# Patient Record
Sex: Male | Born: 1965 | Hispanic: Yes | Marital: Single | Smoking: Never smoker
Health system: Southern US, Community
[De-identification: ages and names within clinical notes are randomized; demographics above are authoritative.]

---

## 2017-07-25 ENCOUNTER — Other Ambulatory Visit: Payer: Self-pay

## 2017-07-25 ENCOUNTER — Emergency Department
Admission: EM | Admit: 2017-07-25 | Discharge: 2017-07-26 | Disposition: A | Discharge status: Home / Self Care | Payer: No Typology Code available for payment source | Attending: Emergency Medicine | Admitting: Emergency Medicine

## 2017-07-25 ENCOUNTER — Emergency Department: Payer: No Typology Code available for payment source

## 2017-07-25 ENCOUNTER — Encounter: Payer: Self-pay | Admitting: Emergency Medicine

## 2017-07-25 DIAGNOSIS — T07XXXA Unspecified multiple injuries, initial encounter: Secondary | ICD-10-CM

## 2017-07-25 DIAGNOSIS — S0003XA Contusion of scalp, initial encounter: Secondary | ICD-10-CM | POA: Insufficient documentation

## 2017-07-25 DIAGNOSIS — M791 Myalgia, unspecified site: Secondary | ICD-10-CM | POA: Insufficient documentation

## 2017-07-25 DIAGNOSIS — S0990XA Unspecified injury of head, initial encounter: Secondary | ICD-10-CM

## 2017-07-25 DIAGNOSIS — Y929 Unspecified place or not applicable: Secondary | ICD-10-CM | POA: Diagnosis not present

## 2017-07-25 DIAGNOSIS — Y9389 Activity, other specified: Secondary | ICD-10-CM | POA: Insufficient documentation

## 2017-07-25 DIAGNOSIS — Y999 Unspecified external cause status: Secondary | ICD-10-CM | POA: Diagnosis not present

## 2017-07-25 DIAGNOSIS — M7918 Myalgia, other site: Secondary | ICD-10-CM

## 2017-07-25 MED ORDER — OXYCODONE-ACETAMINOPHEN 5-325 MG PO TABS
2.0000 | ORAL_TABLET | Freq: Once | ORAL | Status: AC
  Administered 2017-07-25: 2 via ORAL
  Filled 2017-07-25: qty 2

## 2017-07-25 NOTE — ED Notes (Signed)
Dr. Webster at bedside.  
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
## 2017-07-25 NOTE — ED Notes (Signed)
Pt to CT and xray via stretcher.

## 2017-07-26 MED ORDER — TRAMADOL HCL 50 MG PO TABS: 50.0000 mg | ORAL_TABLET | Freq: Four times a day (QID) | ORAL | 0 refills | Status: DC | PRN

## 2017-07-26 MED ORDER — BACITRACIN ZINC 500 UNIT/GM EX OINT
TOPICAL_OINTMENT | Freq: Once | CUTANEOUS | Status: AC
  Administered 2017-07-26: 5 via TOPICAL
  Filled 2017-07-26: qty 4.5

## 2017-07-26 NOTE — Discharge Instructions (Addendum)
Follow up with your primary care physician
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
## 2017-07-26 NOTE — ED Notes (Signed)
Dr webster in to followup 
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
## 2020-11-18 ENCOUNTER — Other Ambulatory Visit: Payer: Self-pay | Admitting: Physician Assistant

## 2020-11-18 ENCOUNTER — Ambulatory Visit
Admission: RE | Admit: 2020-11-18 | Discharge: 2020-11-18 | Disposition: A | Discharge status: Home / Self Care | Payer: Worker's Compensation | Source: Ambulatory Visit | Attending: Physician Assistant | Admitting: Physician Assistant

## 2020-11-18 DIAGNOSIS — M25511 Pain in right shoulder: Secondary | ICD-10-CM | POA: Diagnosis present

## 2020-11-21 ENCOUNTER — Other Ambulatory Visit: Payer: Self-pay

## 2020-11-21 ENCOUNTER — Emergency Department
Admission: EM | Admit: 2020-11-21 | Discharge: 2020-11-21 | Disposition: A | Discharge status: Home / Self Care | Payer: BC Managed Care – PPO | Attending: Student in an Organized Health Care Education/Training Program | Admitting: Student in an Organized Health Care Education/Training Program

## 2020-11-21 DIAGNOSIS — R350 Frequency of micturition: Secondary | ICD-10-CM | POA: Diagnosis present

## 2020-11-21 DIAGNOSIS — R300 Dysuria: Secondary | ICD-10-CM | POA: Insufficient documentation

## 2020-11-21 LAB — URINALYSIS, COMPLETE (UACMP) WITH MICROSCOPIC
Bacteria, UA: NONE SEEN
Bilirubin Urine: NEGATIVE
Glucose, UA: NEGATIVE mg/dL
Hgb urine dipstick: NEGATIVE
Ketones, ur: NEGATIVE mg/dL
Leukocytes,Ua: NEGATIVE
Nitrite: NEGATIVE
Protein, ur: NEGATIVE mg/dL
Specific Gravity, Urine: 1.019 (ref 1.005–1.030)
Squamous Epithelial / LPF: NONE SEEN (ref 0–5)
WBC, UA: NONE SEEN WBC/hpf (ref 0–5)
pH: 6 (ref 5.0–8.0)

## 2020-11-21 MED ORDER — TAMSULOSIN HCL 0.4 MG PO CAPS: 0.4000 mg | ORAL_CAPSULE | Freq: Every day | ORAL | 0 refills | Status: DC

## 2020-11-21 NOTE — ED Provider Notes (Signed)
[facility] Emergency Department Provider Note    Event Date/Time   First MD Initiated Contact with Patient 11/21/20 1829     (approximate)  I have reviewed the triage vital signs and the nursing notes.   HISTORY  Chief Complaint Dysuria    HPI Miguel Caldwell is a 55 y.o. male presents to the ER for concern for increased urinary frequency as well as hesitancy and feeling of incomplete voiding.  Denies any hematuria no fevers.  The symptoms of been ongoing for more than a year.  Denies any other concerns.  States he is primarily hoping to have his prostate evaluated.  Does not have a PCP.  History reviewed. No pertinent past medical history. No family history on file. History reviewed. No pertinent surgical history. There are no problems to display for this patient.     Prior to Admission medications   Medication Sig Start Date End Date Taking? Authorizing Provider  tamsulosin (FLOMAX) 0.4 MG CAPS capsule Take 1 capsule (0.4 mg total) by mouth daily after supper. 11/21/20  Yes Willy Eddy, MD  traMADol (ULTRAM) 50 MG tablet Take 1 tablet (50 mg total) by mouth every 6 (six) hours as needed. 07/26/17   Rebecka Apley, MD    Allergies Patient has no known allergies.    Social History Social History   Tobacco Use   Smoking status: Never   Smokeless tobacco: Never  Substance Use Topics   Alcohol use: Yes   Drug use: Never    Review of Systems Patient denies headaches, rhinorrhea, blurry vision, numbness, shortness of breath, chest pain, edema, cough, abdominal pain, nausea, vomiting, diarrhea, dysuria, fevers, rashes or hallucinations unless otherwise stated above in HPI. ____________________________________________   PHYSICAL EXAM:  VITAL SIGNS: Vitals:   11/21/20 1656  BP: 129/82  Pulse: 66  Resp: 16  Temp: 98.5 F (36.9 C)  SpO2: 96%    Constitutional: Alert and oriented. Well appearing and in no acute  distress. Eyes: Conjunctivae are normal.  Head: Atraumatic. Nose: No congestion/rhinnorhea. Mouth/Throat: Mucous membranes are moist.   Neck: Painless ROM.  Cardiovascular:   Good peripheral circulation. Respiratory: Normal respiratory effort.  No retractions.  Gastrointestinal: Soft and nontender.  Musculoskeletal: No lower extremity tenderness .  No joint effusions. Neurologic:  Normal speech and language. No gross focal neurologic deficits are appreciated.  Skin:  Skin is warm, dry and intact. No rash noted. Psychiatric: Mood and affect are normal. Speech and behavior are normal.  ____________________________________________   LABS (all labs ordered are listed, but only abnormal results are displayed)  Results for orders placed or performed during the hospital encounter of 11/21/20 (from the past 24 hour(s))  Urinalysis, Complete w Microscopic     Status: Abnormal   Collection Time: 11/21/20  5:03 PM  Result Value Ref Range   Color, Urine YELLOW (A) YELLOW   APPearance CLEAR (A) CLEAR   Specific Gravity, Urine 1.019 1.005 - 1.030   pH 6.0 5.0 - 8.0   Glucose, UA NEGATIVE NEGATIVE mg/dL   Hgb urine dipstick NEGATIVE NEGATIVE   Bilirubin Urine NEGATIVE NEGATIVE   Ketones, ur NEGATIVE NEGATIVE mg/dL   Protein, ur NEGATIVE NEGATIVE mg/dL   Nitrite NEGATIVE NEGATIVE   Leukocytes,Ua NEGATIVE NEGATIVE   RBC / HPF 0-5 0 - 5 RBC/hpf   WBC, UA NONE SEEN 0 - 5 WBC/hpf   Bacteria, UA NONE SEEN NONE SEEN   Squamous Epithelial / LPF NONE SEEN 0 - 5  ____________________________________________ ____________________________________________   PROCEDURES  Procedure(s) performed:  Procedures    Critical Care performed: no ____________________________________________   INITIAL IMPRESSION / ASSESSMENT AND PLAN / ED COURSE  Pertinent labs & imaging results that were available during my care of the patient were reviewed by me and considered in my medical decision making (see  chart for details).   DDX: Prostatitis, BPH, cystitis, urethritis, urinary retention  [name] is a 55 y.o. who presents to the ED with presentation as described above most consistent with BPH.  I recommended digital prostate exam which patient declines.  Would like to trial Flomax.  No sign of UTI.  Exam otherwise reassuring.  Will give referral for outpatient follow-up discussed signs and symptoms for which he should return to the ER.    The patient was evaluated in Emergency Department today for the symptoms described in the history of present illness. He/she was evaluated in the context of the global COVID-19 pandemic, which necessitated consideration that the patient might be at risk for infection with the SARS-CoV-2 virus that causes COVID-19. Institutional protocols and algorithms that pertain to the evaluation of patients at risk for COVID-19 are in a state of rapid change based on information released by regulatory bodies including the CDC and federal and state organizations. These policies and algorithms were followed during the patient's care in the ED.   ____________________________________________   FINAL CLINICAL IMPRESSION(S) / ED DIAGNOSES  Final diagnoses:  Urinary frequency      NEW MEDICATIONS STARTED DURING THIS VISIT:  New Prescriptions   TAMSULOSIN (FLOMAX) 0.4 MG CAPS CAPSULE    Take 1 capsule (0.4 mg total) by mouth daily after supper.     Note:  This document was prepared using Dragon voice recognition software and may include unintentional dictation errors.     Willy Eddy, MD 11/21/20 [phone]

## 2020-11-21 NOTE — ED Triage Notes (Signed)
Pt to ER via POV with complaints of urinary symptoms x2 years.   Pt reports issue with starting a stream and difficulty maintaining stream. Pt also reports for one year he has been having intermittent dyuria and pain to the tip of his penis when he urinates. Reports some days he feels fine and is able to urinate as normal, other times it is painful and difficult.   Pt reports today it is very painful when he tries to urinate.

## 2022-09-11 ENCOUNTER — Ambulatory Visit: Payer: BC Managed Care – PPO | Admitting: Internal Medicine

## 2022-09-11 ENCOUNTER — Encounter: Payer: Self-pay | Admitting: Internal Medicine

## 2022-09-11 VITALS — BP 124/64 | HR 84 | Ht 68.0 in | Wt 173.0 lb

## 2022-09-11 DIAGNOSIS — Z1331 Encounter for screening for depression: Secondary | ICD-10-CM | POA: Diagnosis not present

## 2022-09-11 DIAGNOSIS — N401 Enlarged prostate with lower urinary tract symptoms: Secondary | ICD-10-CM | POA: Diagnosis not present

## 2022-09-11 DIAGNOSIS — R310 Gross hematuria: Secondary | ICD-10-CM | POA: Diagnosis not present

## 2022-09-11 DIAGNOSIS — N410 Acute prostatitis: Secondary | ICD-10-CM

## 2022-09-11 DIAGNOSIS — R3914 Feeling of incomplete bladder emptying: Secondary | ICD-10-CM

## 2022-09-11 LAB — POCT URINALYSIS DIPSTICK
Bilirubin, UA: NEGATIVE
Blood, UA: NEGATIVE
Glucose, UA: NEGATIVE
Ketones, UA: NEGATIVE
Leukocytes, UA: NEGATIVE
Nitrite, UA: NEGATIVE
Protein, UA: NEGATIVE
Spec Grav, UA: 1.02 (ref 1.010–1.025)
Urobilinogen, UA: 0.2 E.U./dL
pH, UA: 6 (ref 5.0–8.0)

## 2022-09-11 MED ORDER — TAMSULOSIN HCL 0.4 MG PO CAPS: 0.4000 mg | ORAL_CAPSULE | Freq: Every day | ORAL | 0 refills | Status: DC

## 2022-09-11 MED ORDER — SULFAMETHOXAZOLE-TRIMETHOPRIM 800-160 MG PO TABS: 1.0000 | ORAL_TABLET | Freq: Two times a day (BID) | ORAL | 0 refills | Status: AC

## 2022-09-11 NOTE — Progress Notes (Signed)
Established Patient Office Visit  Subjective:  Patient ID: Miguel Caldwell, male    DOB: Jan 16, 1966  Age: 57 y.o. MRN: [id number]  Chief Complaint  Patient presents with   Follow-up   New Patient (Initial Visit)    New patient visit    Here to establish IM care. C/o several months history of intermittent hematuria and recent pain in his perineal region. C/o slower urinary stream, incomplete bladder emptying and nocturia.    No other concerns at this time.   History reviewed. No pertinent past medical history.  History reviewed. No pertinent surgical history.  Social History   Socioeconomic History   Marital status: Single    Spouse name: Not on file   Number of children: 1   Years of education: Not on file   Highest education level: 4th grade  Occupational History   Occupation: Producer, television/film/video  Tobacco Use   Smoking status: Never   Smokeless tobacco: Never  Vaping Use   Vaping Use: Never used  Substance and Sexual Activity   Alcohol use: Yes    Alcohol/week: 3.0 standard drinks of alcohol    Types: 3 Cans of beer per week   Drug use: Never   Sexual activity: Yes  Other Topics Concern   Not on file  Social History Narrative   Not on file   Social Determinants of Health   Financial Resource Strain: Not on file  Food Insecurity: Not on file  Transportation Needs: Not on file  Physical Activity: Not on file  Stress: Not on file  Social Connections: Not on file  Intimate Partner Violence: Not on file    History reviewed. No pertinent family history.  No Known Allergies  Review of Systems  Constitutional: Negative.   HENT: Negative.    Eyes: Negative.   Respiratory: Negative.    Cardiovascular: Negative.   Gastrointestinal: Negative.   Genitourinary: Negative.   Musculoskeletal:  Positive for joint pain and myalgias.       Pain in elbows bilaterally  Skin: Negative.   Neurological: Negative.   Endo/Heme/Allergies: Negative.         Objective:   BP 124/64   Pulse 84   Ht 5\' 8"  (1.727 m)   Wt 173 lb (78.5 kg)   SpO2 95%   BMI 26.30 kg/m   Vitals:   09/11/22 1428  BP: 124/64  Pulse: 84  Height: 5\' 8"  (1.727 m)  Weight: 173 lb (78.5 kg)  SpO2: 95%  BMI (Calculated): 26.31    Physical Exam Vitals reviewed.  Constitutional:      Appearance: Normal appearance.  HENT:     Head: Normocephalic.     Left Ear: There is no impacted cerumen.     Nose: Nose normal.     Mouth/Throat:     Mouth: Mucous membranes are moist.     Pharynx: No posterior oropharyngeal erythema.  Eyes:     Extraocular Movements: Extraocular movements intact.     Pupils: Pupils are equal, round, and reactive to light.  Cardiovascular:     Rate and Rhythm: Regular rhythm.     Chest Wall: PMI is not displaced.     Pulses: Normal pulses.     Heart sounds: Normal heart sounds. No murmur heard. Pulmonary:     Effort: Pulmonary effort is normal.     Breath sounds: Normal air entry. No rhonchi or rales.  Abdominal:     General: Abdomen is flat. Bowel sounds are normal. There is  no distension.     Palpations: Abdomen is soft. There is no hepatomegaly, splenomegaly or mass.     Tenderness: There is no abdominal tenderness.  Genitourinary:    Comments: Rectal exam: normal sphincteric tone negative without mass, lesions or tenderness, no tenderness noted, no external hemorrhoids noted.  Prostate mildly enlarged but non tender. Musculoskeletal:        General: Normal range of motion.     Cervical back: Normal range of motion and neck supple.     Right lower leg: No edema.     Left lower leg: No edema.  Skin:    General: Skin is warm and dry.  Neurological:     General: No focal deficit present.     Mental Status: He is alert and oriented to person, place, and time.     Cranial Nerves: No cranial nerve deficit.     Motor: No weakness.  Psychiatric:        Mood and Affect: Mood normal.        Behavior: Behavior normal.      Results for orders placed or performed in visit on 09/11/22  POCT Urinalysis Dipstick (81002)  Result Value Ref Range   Color, UA Yellow    Clarity, UA Cloudy    Glucose, UA Negative Negative   Bilirubin, UA Negative    Ketones, UA Negative    Spec Grav, UA 1.020 1.010 - 1.025   Blood, UA Negative    pH, UA 6.0 5.0 - 8.0   Protein, UA Negative Negative   Urobilinogen, UA 0.2 0.2 or 1.0 E.U./dL   Nitrite, UA Negative    Leukocytes, UA Negative Negative   Appearance     Odor      Recent Results (from the past 2160 hour(Miguel Caldwell))  POCT Urinalysis Dipstick ([id number])     Status: Normal   Collection Time: 09/11/22  3:10 PM  Result Value Ref Range   Color, UA Yellow    Clarity, UA Cloudy    Glucose, UA Negative Negative   Bilirubin, UA Negative    Ketones, UA Negative    Spec Grav, UA 1.020 1.010 - 1.025   Blood, UA Negative    pH, UA 6.0 5.0 - 8.0   Protein, UA Negative Negative   Urobilinogen, UA 0.2 0.2 or 1.0 E.U./dL   Nitrite, UA Negative    Leukocytes, UA Negative Negative   Appearance     Odor        Assessment & Plan:  As per problem list. Repeat PSA after completion of atbs if elevated.  Problem List Items Addressed This Visit       Genitourinary   Acute prostatitis - Primary   Relevant Medications   sulfamethoxazole-trimethoprim (BACTRIM DS) 800-160 MG tablet   tamsulosin (FLOMAX) 0.4 MG CAPS capsule   Other Relevant Orders   POCT Urinalysis Dipstick ([id number]) (Completed)   Benign prostatic hyperplasia with incomplete bladder emptying   Relevant Medications   tamsulosin (FLOMAX) 0.4 MG CAPS capsule   Other Relevant Orders   PSA   CBC With Diff/Platelet   Other Visit Diagnoses     Gross hematuria       Relevant Orders   Protime-INR   APTT   POCT Urinalysis Dipstick ([id number]) (Completed)       Return in about 2 weeks (around 09/25/2022) for lab results.   Total time spent: 35 minutes  Luna Fuse, MD  09/11/2022   This document  may have been prepared by Miguel Caldwell  Voice Recognition software and as such may include unintentional dictation errors.

## 2022-09-12 LAB — CBC WITH DIFF/PLATELET
Basophils Absolute: 0 10*3/uL (ref 0.0–0.2)
Basos: 1 %
EOS (ABSOLUTE): 0.1 10*3/uL (ref 0.0–0.4)
Eos: 2 %
Hematocrit: 48.6 % (ref 37.5–51.0)
Hemoglobin: 16.6 g/dL (ref 13.0–17.7)
Immature Grans (Abs): 0 10*3/uL (ref 0.0–0.1)
Immature Granulocytes: 0 %
Lymphocytes Absolute: 2 10*3/uL (ref 0.7–3.1)
Lymphs: 38 %
MCH: 30.6 pg (ref 26.6–33.0)
MCHC: 34.2 g/dL (ref 31.5–35.7)
MCV: 90 fL (ref 79–97)
Monocytes Absolute: 0.4 10*3/uL (ref 0.1–0.9)
Monocytes: 8 %
Neutrophils Absolute: 2.7 10*3/uL (ref 1.4–7.0)
Neutrophils: 51 %
Platelets: 231 10*3/uL (ref 150–450)
RBC: 5.42 x10E6/uL (ref 4.14–5.80)
RDW: 13.3 % (ref 11.6–15.4)
WBC: 5.2 10*3/uL (ref 3.4–10.8)

## 2022-09-12 LAB — PSA: Prostate Specific Ag, Serum: 0.6 ng/mL (ref 0.0–4.0)

## 2022-09-15 NOTE — Progress Notes (Signed)
Patient notified

## 2022-09-29 ENCOUNTER — Ambulatory Visit: Payer: BC Managed Care – PPO | Admitting: Internal Medicine

## 2022-10-14 ENCOUNTER — Ambulatory Visit (INDEPENDENT_AMBULATORY_CARE_PROVIDER_SITE_OTHER): Payer: BC Managed Care – PPO | Admitting: Internal Medicine

## 2022-10-14 VITALS — BP 125/79 | HR 75 | Ht 68.0 in | Wt 183.4 lb

## 2022-10-14 DIAGNOSIS — N401 Enlarged prostate with lower urinary tract symptoms: Secondary | ICD-10-CM | POA: Diagnosis not present

## 2022-10-14 DIAGNOSIS — R3914 Feeling of incomplete bladder emptying: Secondary | ICD-10-CM

## 2022-10-14 DIAGNOSIS — Z0001 Encounter for general adult medical examination with abnormal findings: Secondary | ICD-10-CM

## 2022-10-14 DIAGNOSIS — N410 Acute prostatitis: Secondary | ICD-10-CM

## 2022-10-14 MED ORDER — TAMSULOSIN HCL 0.4 MG PO CAPS: 0.4000 mg | ORAL_CAPSULE | Freq: Every day | ORAL | 0 refills | Status: AC

## 2022-10-14 NOTE — Progress Notes (Signed)
Established Patient Office Visit  Subjective:  Patient ID: Miguel Caldwell, male    DOB: Feb 22, 1966  Age: 57 y.o. MRN: [id number]  Chief Complaint  Patient presents with   Follow-up    2 week F/U    Urinary symptoms have improved, no hematuria or perineal discomfort but only started taking antibiotics a week ago.    No other concerns at this time.   No past medical history on file.  No past surgical history on file.  Social History   Socioeconomic History   Marital status: Single    Spouse name: Not on file   Number of children: 1   Years of education: Not on file   Highest education level: 4th grade  Occupational History   Occupation: Producer, television/film/video  Tobacco Use   Smoking status: Never   Smokeless tobacco: Never  Vaping Use   Vaping status: Never Used  Substance and Sexual Activity   Alcohol use: Yes    Alcohol/week: 3.0 standard drinks of alcohol    Types: 3 Cans of beer per week   Drug use: Never   Sexual activity: Yes  Other Topics Concern   Not on file  Social History Narrative   Not on file   Social Determinants of Health   Financial Resource Strain: Not on file  Food Insecurity: Not on file  Transportation Needs: Not on file  Physical Activity: Not on file  Stress: Not on file  Social Connections: Not on file  Intimate Partner Violence: Not on file    No family history on file.  No Known Allergies  Review of Systems  Constitutional: Negative.   HENT: Negative.    Eyes: Negative.   Respiratory: Negative.    Cardiovascular: Negative.   Gastrointestinal: Negative.   Genitourinary: Negative.   Musculoskeletal:  Positive for joint pain and myalgias.       Pain in elbows bilaterally  Skin: Negative.   Neurological: Negative.   Endo/Heme/Allergies: Negative.        Objective:   BP 125/79   Pulse 75   Ht 5\' 8"  (1.727 m)   Wt 183 lb 6.4 oz (83.2 kg)   SpO2 94%   BMI 27.89 kg/m   Vitals:   10/14/22 0910  BP: 125/79   Pulse: 75  Height: 5\' 8"  (1.727 m)  Weight: 183 lb 6.4 oz (83.2 kg)  SpO2: 94%  BMI (Calculated): 27.89    Physical Exam Vitals reviewed.  Constitutional:      Appearance: Normal appearance.  HENT:     Head: Normocephalic.     Left Ear: There is no impacted cerumen.     Nose: Nose normal.     Mouth/Throat:     Mouth: Mucous membranes are moist.     Pharynx: No posterior oropharyngeal erythema.  Eyes:     Extraocular Movements: Extraocular movements intact.     Pupils: Pupils are equal, round, and reactive to light.  Cardiovascular:     Rate and Rhythm: Regular rhythm.     Chest Wall: PMI is not displaced.     Pulses: Normal pulses.     Heart sounds: Normal heart sounds. No murmur heard. Pulmonary:     Effort: Pulmonary effort is normal.     Breath sounds: Normal air entry. No rhonchi or rales.  Abdominal:     General: Abdomen is flat. Bowel sounds are normal. There is no distension.     Palpations: Abdomen is soft. There is no hepatomegaly, splenomegaly  or mass.     Tenderness: There is no abdominal tenderness.  Genitourinary:    Comments: Rectal exam: normal sphincteric tone negative without mass, lesions or tenderness, no tenderness noted, no external hemorrhoids noted.  Prostate mildly enlarged but non tender. Musculoskeletal:        General: Normal range of motion.     Cervical back: Normal range of motion and neck supple.     Right lower leg: No edema.     Left lower leg: No edema.  Skin:    General: Skin is warm and dry.  Neurological:     General: No focal deficit present.     Mental Status: He is alert and oriented to person, place, and time.     Cranial Nerves: No cranial nerve deficit.     Motor: No weakness.  Psychiatric:        Mood and Affect: Mood normal.        Behavior: Behavior normal.      No results found for any visits on 10/14/22.  Recent Results (from the past 2160 hour(Durant Scibilia))  POCT Urinalysis Dipstick ([id number])     Status: Normal    Collection Time: 09/11/22  3:10 PM  Result Value Ref Range   Color, UA Yellow    Clarity, UA Cloudy    Glucose, UA Negative Negative   Bilirubin, UA Negative    Ketones, UA Negative    Spec Grav, UA 1.020 1.010 - 1.025   Blood, UA Negative    pH, UA 6.0 5.0 - 8.0   Protein, UA Negative Negative   Urobilinogen, UA 0.2 0.2 or 1.0 E.U./dL   Nitrite, UA Negative    Leukocytes, UA Negative Negative   Appearance     Odor    PSA     Status: None   Collection Time: 09/11/22  3:11 PM  Result Value Ref Range   Prostate Specific Ag, Serum 0.6 0.0 - 4.0 ng/mL    Comment: Roche ECLIA methodology. According to the American Urological Association, Serum PSA should decrease and remain at undetectable levels after radical prostatectomy. The AUA defines biochemical recurrence as an initial PSA value 0.2 ng/mL or greater followed by a subsequent confirmatory PSA value 0.2 ng/mL or greater. Values obtained with different assay methods or kits cannot be used interchangeably. Results cannot be interpreted as absolute evidence of the presence or absence of malignant disease.   CBC With Diff/Platelet     Status: None   Collection Time: 09/11/22  3:11 PM  Result Value Ref Range   WBC 5.2 3.4 - 10.8 x10E3/uL   RBC 5.42 4.14 - 5.80 x10E6/uL   Hemoglobin 16.6 13.0 - 17.7 g/dL   Hematocrit 56.2 13.0 - 51.0 %   MCV 90 79 - 97 fL   MCH 30.6 26.6 - 33.0 pg   MCHC 34.2 31.5 - 35.7 g/dL   RDW 86.5 78.4 - 69.6 %   Platelets 231 150 - 450 x10E3/uL   Neutrophils 51 Not Estab. %   Lymphs 38 Not Estab. %   Monocytes 8 Not Estab. %   Eos 2 Not Estab. %   Basos 1 Not Estab. %   Neutrophils Absolute 2.7 1.4 - 7.0 x10E3/uL   Lymphocytes Absolute 2.0 0.7 - 3.1 x10E3/uL   Monocytes Absolute 0.4 0.1 - 0.9 x10E3/uL   EOS (ABSOLUTE) 0.1 0.0 - 0.4 x10E3/uL   Basophils Absolute 0.0 0.0 - 0.2 x10E3/uL   Immature Granulocytes 0 Not Estab. %   Immature Grans (Abs)  0.0 0.0 - 0.1 x10E3/uL      Assessment & Plan:   Complete atbs and check PSA next week Problem List Items Addressed This Visit       Genitourinary   Acute prostatitis - Primary   Relevant Orders   PSA   Benign prostatic hyperplasia with incomplete bladder emptying   Other Visit Diagnoses     Encounter for general adult medical examination with abnormal findings       Relevant Orders   Comprehensive metabolic panel   Lipid panel   CBC With Diff/Platelet       Return in about 2 weeks (around 10/28/2022) for cpe with labs prior.   Total time spent: 20 minutes  Luna Fuse, MD  10/14/2022   This document may have been prepared by [facility] Voice Recognition software and as such may include unintentional dictation errors.

## 2022-10-28 ENCOUNTER — Ambulatory Visit (INDEPENDENT_AMBULATORY_CARE_PROVIDER_SITE_OTHER): Payer: BC Managed Care – PPO | Admitting: Internal Medicine

## 2022-10-28 VITALS — BP 121/75 | HR 63 | Ht 68.0 in | Wt 187.6 lb

## 2022-10-28 DIAGNOSIS — N410 Acute prostatitis: Secondary | ICD-10-CM | POA: Diagnosis not present

## 2022-10-28 DIAGNOSIS — N401 Enlarged prostate with lower urinary tract symptoms: Secondary | ICD-10-CM | POA: Diagnosis not present

## 2022-10-28 DIAGNOSIS — R3914 Feeling of incomplete bladder emptying: Secondary | ICD-10-CM | POA: Diagnosis not present

## 2022-10-28 DIAGNOSIS — Z0001 Encounter for general adult medical examination with abnormal findings: Secondary | ICD-10-CM

## 2022-10-28 NOTE — Progress Notes (Signed)
Established Patient Office Visit  Subjective:  Patient ID: Miguel Caldwell, male    DOB: [birth year]  Age: 57 y.o. MRN: [id number]  No chief complaint on file.   Still hasn't finished his antibiotics as he missed 3 days of Rx. Also failed to have his previsit labs done.    No other concerns at this time.   No past medical history on file.  No past surgical history on file.  Social History   Socioeconomic History   Marital status: Single    Spouse name: Not on file   Number of children: 1   Years of education: Not on file   Highest education level: 4th grade  Occupational History   Occupation: Producer, television/film/video  Tobacco Use   Smoking status: Never   Smokeless tobacco: Never  Vaping Use   Vaping status: Never Used  Substance and Sexual Activity   Alcohol use: Yes    Alcohol/week: 3.0 standard drinks of alcohol    Types: 3 Cans of beer per week   Drug use: Never   Sexual activity: Yes  Other Topics Concern   Not on file  Social History Narrative   Not on file   Social Determinants of Health   Financial Resource Strain: Not on file  Food Insecurity: Not on file  Transportation Needs: Not on file  Physical Activity: Not on file  Stress: Not on file  Social Connections: Not on file  Intimate Partner Violence: Not on file    No family history on file.  No Known Allergies  Review of Systems  Constitutional: Negative.   HENT: Negative.    Eyes: Negative.   Respiratory: Negative.    Cardiovascular: Negative.   Gastrointestinal: Negative.   Genitourinary: Negative.   Musculoskeletal:  Positive for joint pain and myalgias.       Pain in elbows bilaterally  Skin: Negative.   Neurological: Negative.   Endo/Heme/Allergies: Negative.        Objective:   BP 121/75   Pulse 63   Ht 5\' 8"  (1.727 m)   Wt 187 lb 9.6 oz (85.1 kg)   SpO2 96%   BMI 28.52 kg/m   Vitals:   10/28/22 0936  BP: 121/75  Pulse: 63  Height: 5\' 8"  (1.727 m)  Weight:  187 lb 9.6 oz (85.1 kg)  SpO2: 96%  BMI (Calculated): 28.53    Physical Exam Vitals reviewed.  Constitutional:      Appearance: Normal appearance.  HENT:     Head: Normocephalic.     Left Ear: There is no impacted cerumen.     Nose: Nose normal.     Mouth/Throat:     Mouth: Mucous membranes are moist.     Pharynx: No posterior oropharyngeal erythema.  Eyes:     Extraocular Movements: Extraocular movements intact.     Pupils: Pupils are equal, round, and reactive to light.  Cardiovascular:     Rate and Rhythm: Regular rhythm.     Chest Wall: PMI is not displaced.     Pulses: Normal pulses.     Heart sounds: Normal heart sounds. No murmur heard. Pulmonary:     Effort: Pulmonary effort is normal.     Breath sounds: Normal air entry. No rhonchi or rales.  Abdominal:     General: Abdomen is flat. Bowel sounds are normal. There is no distension.     Palpations: Abdomen is soft. There is no hepatomegaly, splenomegaly or mass.     Tenderness: There  is no abdominal tenderness.  Musculoskeletal:        General: Normal range of motion.     Cervical back: Normal range of motion and neck supple.     Right lower leg: No edema.     Left lower leg: No edema.  Skin:    General: Skin is warm and dry.  Neurological:     General: No focal deficit present.     Mental Status: He is alert and oriented to person, place, and time.     Cranial Nerves: No cranial nerve deficit.     Motor: No weakness.  Psychiatric:        Mood and Affect: Mood normal.        Behavior: Behavior normal.      No results found for any visits on 10/28/22.  Recent Results (from the past 2160 hour(Miguel Caldwell))  POCT Urinalysis Dipstick (16109)     Status: Normal   Collection Time: 09/11/22  3:10 PM  Result Value Ref Range   Color, UA Yellow    Clarity, UA Cloudy    Glucose, UA Negative Negative   Bilirubin, UA Negative    Ketones, UA Negative    Spec Grav, UA 1.020 1.010 - 1.025   Blood, UA Negative    pH, UA 6.0  5.0 - 8.0   Protein, UA Negative Negative   Urobilinogen, UA 0.2 0.2 or 1.0 E.U./dL   Nitrite, UA Negative    Leukocytes, UA Negative Negative   Appearance     Odor    PSA     Status: None   Collection Time: 09/11/22  3:11 PM  Result Value Ref Range   Prostate Specific Ag, Serum 0.6 0.0 - 4.0 ng/mL    Comment: Roche ECLIA methodology. According to the American Urological Association, Serum PSA should decrease and remain at undetectable levels after radical prostatectomy. The AUA defines biochemical recurrence as an initial PSA value 0.2 ng/mL or greater followed by a subsequent confirmatory PSA value 0.2 ng/mL or greater. Values obtained with different assay methods or kits cannot be used interchangeably. Results cannot be interpreted as absolute evidence of the presence or absence of malignant disease.   CBC With Diff/Platelet     Status: None   Collection Time: 09/11/22  3:11 PM  Result Value Ref Range   WBC 5.2 3.4 - 10.8 x10E3/uL   RBC 5.42 4.14 - 5.80 x10E6/uL   Hemoglobin 16.6 13.0 - 17.7 g/dL   Hematocrit 60.4 54.0 - 51.0 %   MCV 90 79 - 97 fL   MCH 30.6 26.6 - 33.0 pg   MCHC 34.2 31.5 - 35.7 g/dL   RDW 98.1 19.1 - 47.8 %   Platelets 231 150 - 450 x10E3/uL   Neutrophils 51 Not Estab. %   Lymphs 38 Not Estab. %   Monocytes 8 Not Estab. %   Eos 2 Not Estab. %   Basos 1 Not Estab. %   Neutrophils Absolute 2.7 1.4 - 7.0 x10E3/uL   Lymphocytes Absolute 2.0 0.7 - 3.1 x10E3/uL   Monocytes Absolute 0.4 0.1 - 0.9 x10E3/uL   EOS (ABSOLUTE) 0.1 0.0 - 0.4 x10E3/uL   Basophils Absolute 0.0 0.0 - 0.2 x10E3/uL   Immature Granulocytes 0 Not Estab. %   Immature Grans (Abs) 0.0 0.0 - 0.1 x10E3/uL      Assessment & Plan:  As per problem list. Check CPE at next visit. Problem List Items Addressed This Visit   None Visit Diagnoses  Encounter for general adult medical examination with abnormal findings    -  Primary       Return in about 2 weeks (around 11/11/2022) for  cpe with labs prior.   Total time spent: 20 minutes  Luna Fuse, MD  10/28/2022   This document may have been prepared by [facility] Voice Recognition software and as such may include unintentional dictation errors.

## 2022-11-16 ENCOUNTER — Encounter: Payer: BC Managed Care – PPO | Admitting: Internal Medicine

## 2022-11-23 ENCOUNTER — Other Ambulatory Visit: Payer: BC Managed Care – PPO

## 2022-11-27 ENCOUNTER — Encounter: Payer: BC Managed Care – PPO | Admitting: Internal Medicine

## 2022-12-22 ENCOUNTER — Encounter: Payer: BC Managed Care – PPO | Admitting: Internal Medicine

## 2023-01-04 IMAGING — CR DG SHOULDER 2+V*R*
1 series · 3 of 3 positions shown · non-contrast
Comparison: None.

CLINICAL DATA: Right shoulder pain. Injury a work 3 days ago.
Unable to sleep due to pain.

EXAM:
RIGHT SHOULDER - 2+ VIEW

[Series 1: dg shoulder right · 0.14mm/px · 3 of 3 slices shown]
[im 1/3]
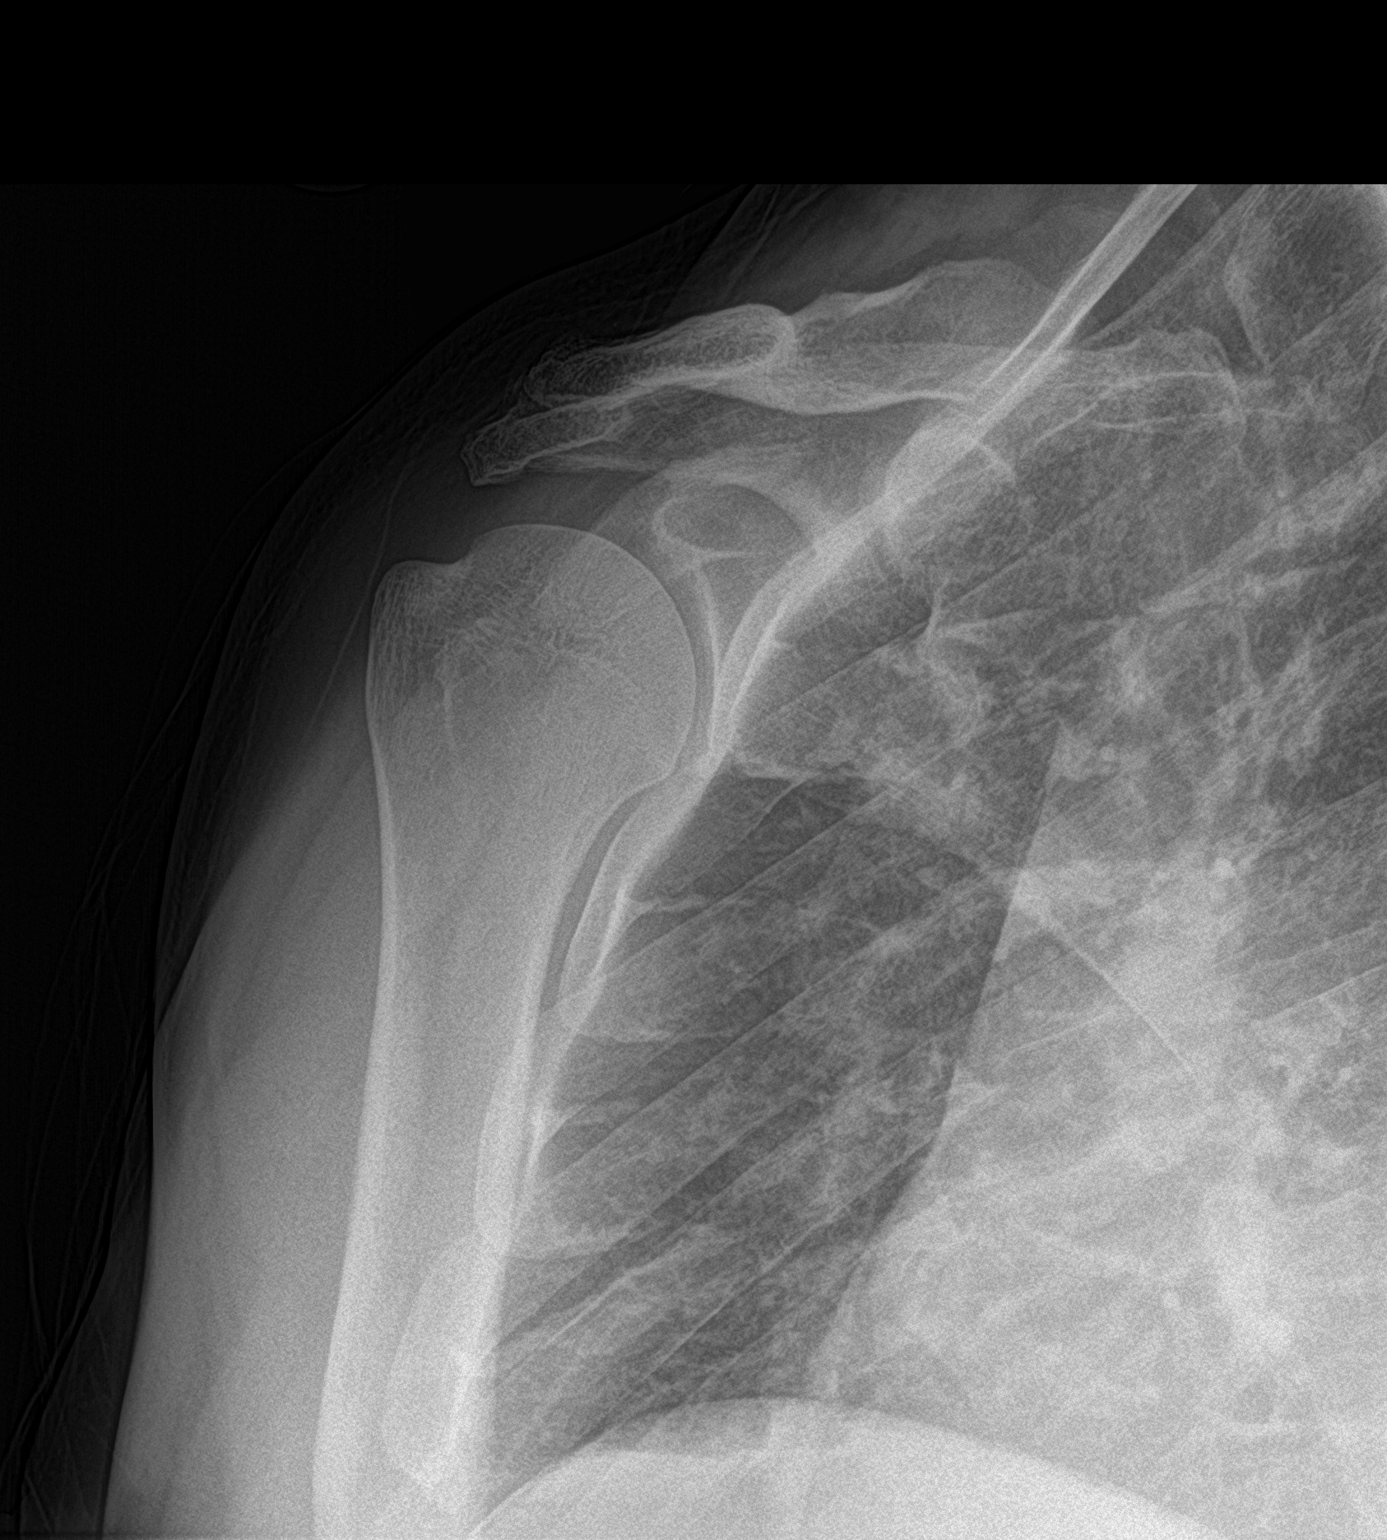
[im 2/3]
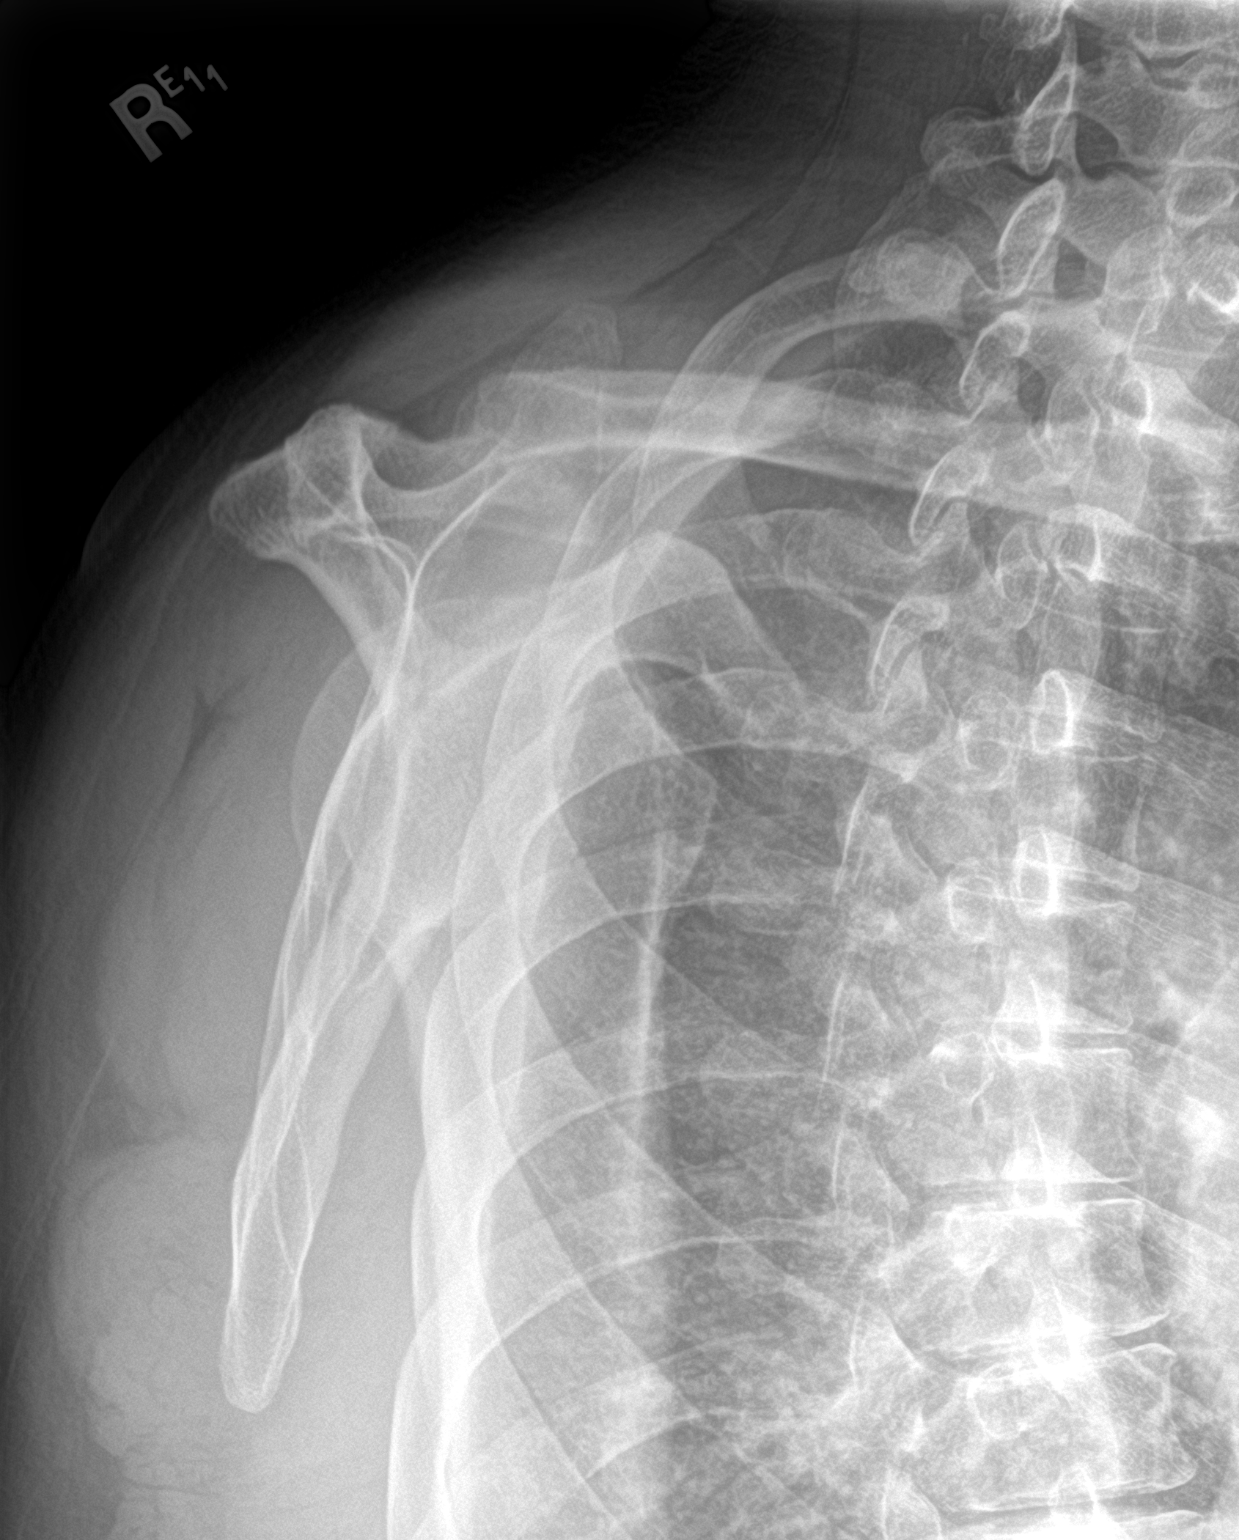
[im 3/3]
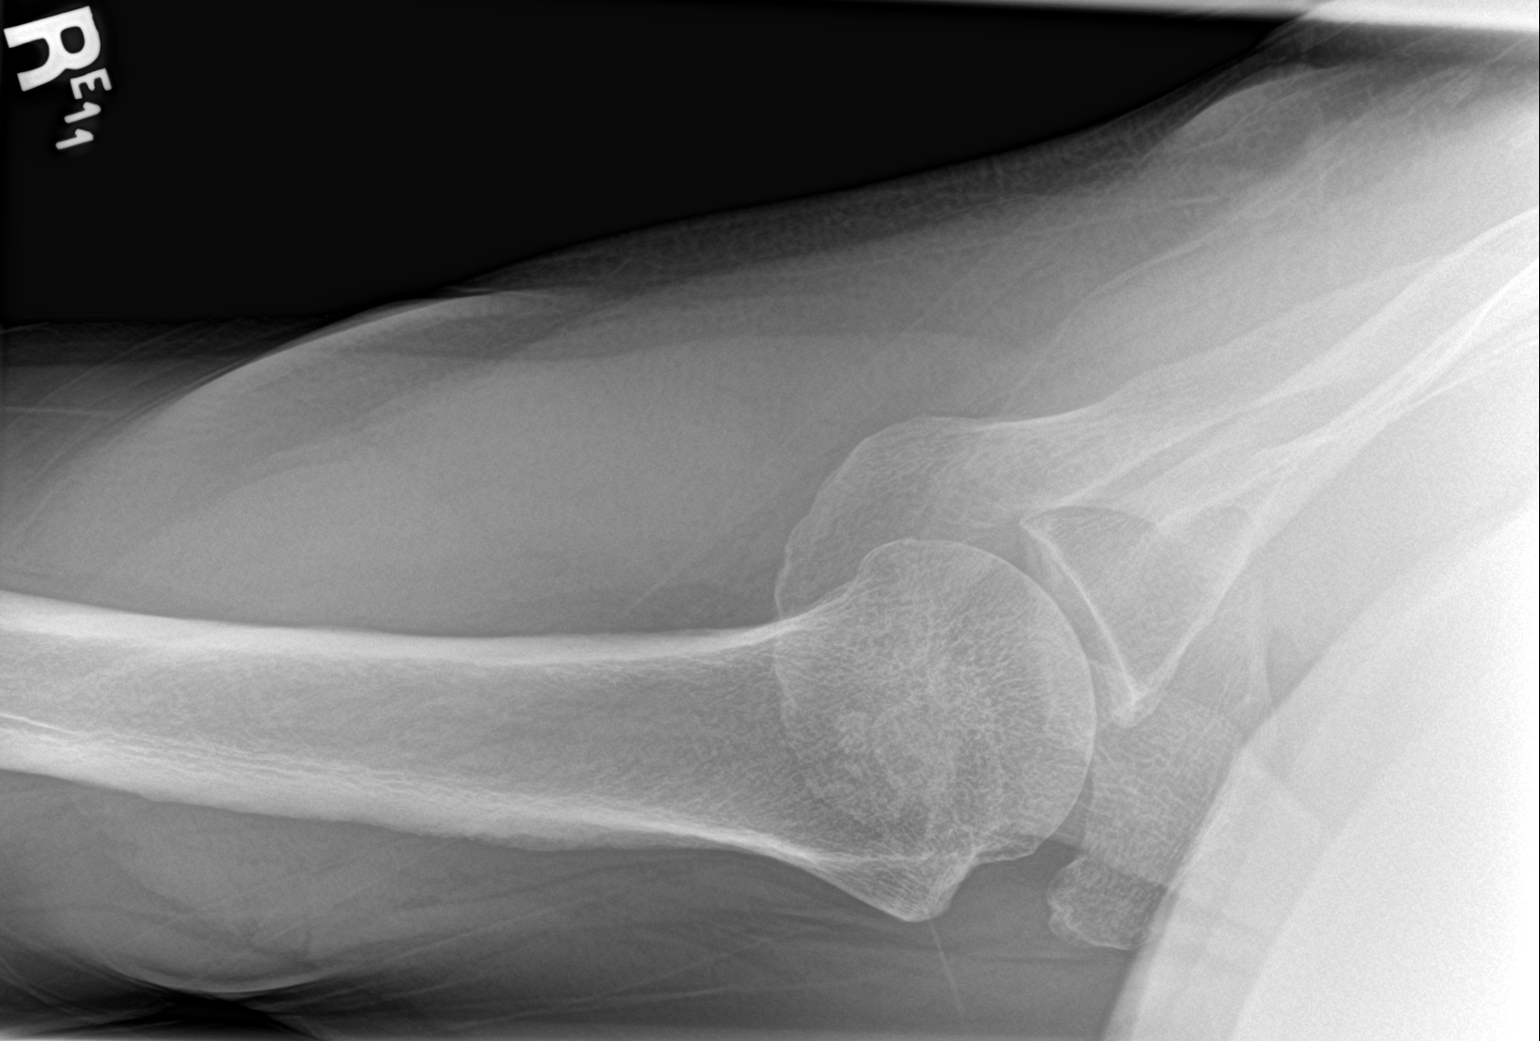

[3 of 3 positions shown; findings below may reference images not displayed]

FINDINGS: There is no evidence of fracture or dislocation. There is no
evidence of arthropathy or other focal bone abnormality. Soft
tissues are unremarkable.
IMPRESSION: Negative right shoulder radiographs.
